# Patient Record
Sex: Female | Born: 1955 | Race: White | Hispanic: No | Marital: Married | Smoking: Former smoker
Health system: Southern US, Community
[De-identification: ages and names within clinical notes are randomized; demographics above are authoritative.]

## PROBLEM LIST (undated history)

## (undated) DIAGNOSIS — E119 Type 2 diabetes mellitus without complications: Secondary | ICD-10-CM

## (undated) DIAGNOSIS — I10 Essential (primary) hypertension: Secondary | ICD-10-CM

## (undated) HISTORY — PX: LEG SURGERY: SHX1003

---

## 2020-02-17 ENCOUNTER — Other Ambulatory Visit: Payer: Self-pay

## 2020-02-17 ENCOUNTER — Emergency Department (HOSPITAL_COMMUNITY): Payer: Managed Care, Other (non HMO)

## 2020-02-17 ENCOUNTER — Encounter (HOSPITAL_COMMUNITY): Payer: Self-pay | Admitting: Emergency Medicine

## 2020-02-17 ENCOUNTER — Emergency Department (HOSPITAL_COMMUNITY)
Admission: EM | Admit: 2020-02-17 | Discharge: 2020-02-17 | Disposition: A | Discharge status: Home / Self Care | Payer: Managed Care, Other (non HMO) | Attending: Emergency Medicine | Admitting: Emergency Medicine

## 2020-02-17 DIAGNOSIS — S92421A Displaced fracture of distal phalanx of right great toe, initial encounter for closed fracture: Secondary | ICD-10-CM | POA: Diagnosis not present

## 2020-02-17 DIAGNOSIS — R55 Syncope and collapse: Secondary | ICD-10-CM | POA: Insufficient documentation

## 2020-02-17 DIAGNOSIS — Z7984 Long term (current) use of oral hypoglycemic drugs: Secondary | ICD-10-CM | POA: Diagnosis not present

## 2020-02-17 DIAGNOSIS — I10 Essential (primary) hypertension: Secondary | ICD-10-CM | POA: Diagnosis not present

## 2020-02-17 DIAGNOSIS — R42 Dizziness and giddiness: Secondary | ICD-10-CM | POA: Diagnosis not present

## 2020-02-17 DIAGNOSIS — Z87891 Personal history of nicotine dependence: Secondary | ICD-10-CM | POA: Diagnosis not present

## 2020-02-17 DIAGNOSIS — Y9389 Activity, other specified: Secondary | ICD-10-CM | POA: Diagnosis not present

## 2020-02-17 DIAGNOSIS — Z79899 Other long term (current) drug therapy: Secondary | ICD-10-CM | POA: Diagnosis not present

## 2020-02-17 DIAGNOSIS — S92325A Nondisplaced fracture of second metatarsal bone, left foot, initial encounter for closed fracture: Secondary | ICD-10-CM | POA: Diagnosis not present

## 2020-02-17 DIAGNOSIS — S99922A Unspecified injury of left foot, initial encounter: Secondary | ICD-10-CM | POA: Diagnosis present

## 2020-02-17 DIAGNOSIS — Y9289 Other specified places as the place of occurrence of the external cause: Secondary | ICD-10-CM | POA: Insufficient documentation

## 2020-02-17 DIAGNOSIS — E119 Type 2 diabetes mellitus without complications: Secondary | ICD-10-CM | POA: Diagnosis not present

## 2020-02-17 DIAGNOSIS — W108XXA Fall (on) (from) other stairs and steps, initial encounter: Secondary | ICD-10-CM | POA: Diagnosis not present

## 2020-02-17 HISTORY — DX: Type 2 diabetes mellitus without complications: E11.9

## 2020-02-17 HISTORY — DX: Essential (primary) hypertension: I10

## 2020-02-17 LAB — CBC WITH DIFFERENTIAL/PLATELET
Abs Immature Granulocytes: 0.06 10*3/uL (ref 0.00–0.07)
Basophils Absolute: 0 10*3/uL (ref 0.0–0.1)
Basophils Relative: 0 %
Eosinophils Absolute: 0.1 10*3/uL (ref 0.0–0.5)
Eosinophils Relative: 1 %
HCT: 43.6 % (ref 36.0–46.0)
Hemoglobin: 13.6 g/dL (ref 12.0–15.0)
Immature Granulocytes: 1 %
Lymphocytes Relative: 19 %
Lymphs Abs: 2.5 10*3/uL (ref 0.7–4.0)
MCH: 30.9 pg (ref 26.0–34.0)
MCHC: 31.2 g/dL (ref 30.0–36.0)
MCV: 99.1 fL (ref 80.0–100.0)
Monocytes Absolute: 0.9 10*3/uL (ref 0.1–1.0)
Monocytes Relative: 7 %
Neutro Abs: 9.6 10*3/uL — ABNORMAL HIGH (ref 1.7–7.7)
Neutrophils Relative %: 72 %
Platelets: 245 10*3/uL (ref 150–400)
RBC: 4.4 MIL/uL (ref 3.87–5.11)
RDW: 12.5 % (ref 11.5–15.5)
WBC: 13.2 10*3/uL — ABNORMAL HIGH (ref 4.0–10.5)
nRBC: 0 % (ref 0.0–0.2)

## 2020-02-17 LAB — BASIC METABOLIC PANEL
Anion gap: 14 (ref 5–15)
BUN: 17 mg/dL (ref 8–23)
CO2: 22 mmol/L (ref 22–32)
Calcium: 9.4 mg/dL (ref 8.9–10.3)
Chloride: 102 mmol/L (ref 98–111)
Creatinine, Ser: 0.88 mg/dL (ref 0.44–1.00)
GFR calc Af Amer: >60 mL/min (ref >60)
GFR calc non Af Amer: >60 mL/min (ref >60)
Glucose, Bld: 171 mg/dL — ABNORMAL HIGH (ref 70–99)
Potassium: 4.6 mmol/L (ref 3.5–5.1)
Sodium: 138 mmol/L (ref 135–145)

## 2020-02-17 MED ORDER — TRAMADOL HCL 50 MG PO TABS: 50.0000 mg | ORAL_TABLET | Freq: Four times a day (QID) | ORAL | 0 refills | Status: DC | PRN

## 2020-02-17 MED ORDER — TRAMADOL HCL 50 MG PO TABS
50.0000 mg | ORAL_TABLET | Freq: Once | ORAL | Status: AC
  Administered 2020-02-17: 50 mg via ORAL
  Filled 2020-02-17: qty 1

## 2020-02-17 NOTE — ED Notes (Signed)
Pt assisted to the restroom via wheelchair

## 2020-02-17 NOTE — ED Notes (Signed)
Working at an [employer] down stairs when another person started up   [place] on the "last step"  Now with pain to her L foot and her R great toe  Also reports that she was told after she fell she "passed out" twice   Pt is neuro intact

## 2020-02-17 NOTE — ED Provider Notes (Signed)
[facility] EMERGENCY DEPARTMENT Provider Note   CSN: [id number] Arrival date & time: 02/17/20  1410     History Chief Complaint  Patient presents with  . Fall    Deborah Hill is a 64 y.o. female.  HPI      Deborah Hill is a 64 y.o. female with past medical history of type 2 diabetes and hypertension.  She presents to the Emergency Department complaining of a mechanical fall that occurred earlier today.  She was carrying objects down 2 steps when she turned around to see if someone was behind her causing her to fall down 1 step onto a landing.  She states that she did not lose consciousness, but was unable to stand due to pain in both feet.  Family members that were close by heard the fall and reports that she had brief syncopal episode after the fall.  Patient also endorses a brief episode of nausea and dizziness without vomiting.  Symptoms resolved after several minutes.  She denies any symptoms currently except for pain to both feet.  She denies symptoms prior to the fall.  History of prior traumatic ankle and lower leg surgery.  She denies any missed doses of her medications or history of hypoglycemia.     Past Medical History:  Diagnosis Date  . Diabetes mellitus without complication (HCC)   . Hypertension     There are no problems to display for this patient.   Past Surgical History:  Procedure Laterality Date  . LEG SURGERY Left      OB History   No obstetric history on file.     History reviewed. No pertinent family history.  Social History   Tobacco Use  . Smoking status: Former Games developer  . Smokeless tobacco: Never Used  Substance Use Topics  . Alcohol use: Never  . Drug use: Never    Home Medications Prior to Admission medications   Medication Sig Start Date End Date Taking? Authorizing Provider  lisinopril (ZESTRIL) 20 MG tablet  12/05/19  Yes [provider]  metFORMIN (GLUCOPHAGE) 500 MG tablet Take by mouth. 11/12/19  Yes [provider]  predniSONE (DELTASONE) 5 MG tablet TAKE 1/2 TABLET BY MOUTH ONCE A DAY 10/31/19  Yes [provider]    Allergies    Oxycodone-acetaminophen  Review of Systems   Review of Systems  Constitutional: Negative for chills and fever.  Respiratory: Negative for cough, chest tightness and wheezing.   Cardiovascular: Negative for chest pain.  Gastrointestinal: Positive for nausea. Negative for abdominal pain, diarrhea and vomiting.  Genitourinary: Negative for difficulty urinating and dysuria.  Musculoskeletal: Positive for arthralgias (Left knee, ankle, and foot pain as well as right great toe pain). Negative for joint swelling and neck pain.  Skin: Negative for color change and wound.  Neurological: Positive for syncope. Negative for dizziness, weakness, numbness and headaches.  Psychiatric/Behavioral: Negative for confusion.       Physical Exam Updated Vital Signs BP (!) 163/80 (BP Location: Right Arm)   Pulse 76   Temp 98.1 F (36.7 C) (Oral)   Resp 18   Ht 5\' 1"  (1.549 m)   Wt 77.1 kg   SpO2 100%   BMI 32.12 kg/m   Physical Exam Vitals and nursing note reviewed.  Constitutional:      Appearance: Normal appearance. She is not ill-appearing, toxic-appearing or diaphoretic.  HENT:     Head: Atraumatic.     Mouth/Throat:     Mouth: Mucous membranes are moist.  Cardiovascular:     Rate and Rhythm: Normal rate and regular rhythm.     Pulses: Normal pulses.  Pulmonary:     Effort: Pulmonary effort is normal.  Chest:     Chest wall: No tenderness.  Abdominal:     General: There is no distension.     Palpations: Abdomen is soft.     Tenderness: There is no abdominal tenderness.  Musculoskeletal:        General: Tenderness and signs of injury present.     Cervical back: Normal range of motion. No tenderness.     Comments: Tenderness to palpation of the anterior left knee.  No bony deformity or palpable effusion.  Patellar tendon appears intact.   Tenderness over the left lateral malleolus and dorsal foot.  No bony deformity.  Well-healed surgical scar to the anterior left ankle and lower leg.  Mild tenderness of the distal right great toe.  Nail is intact and without subungual hematoma.  Skin:    General: Skin is warm.     Capillary Refill: Capillary refill takes less than 2 seconds.     Findings: No rash.  Neurological:     General: No focal deficit present.     Mental Status: She is alert.     Sensory: No sensory deficit.     Motor: No weakness.     ED Results / Procedures / Treatments   Labs (all labs ordered are listed, but only abnormal results are displayed) Labs Reviewed  BASIC METABOLIC PANEL - Abnormal; Notable for the following components:      Result Value   Glucose, Bld 171 (*)    All other components within normal limits  CBC WITH DIFFERENTIAL/PLATELET - Abnormal; Notable for the following components:   WBC 13.2 (*)    Neutro Abs 9.6 (*)    All other components within normal limits    EKG EKG Interpretation  Date/Time:  Saturday February 17 2020 16:41:42 EDT Ventricular Rate:  68 PR Interval:    QRS Duration: 90 QT Interval:  386 QTC Calculation: 411 R Axis:   55 Text Interpretation: Sinus rhythm Baseline wander in lead(s) I III aVL V1 no clear ST changes Confirmed by Benjiman Core [phone]) on 02/17/2020 4:55:27 PM   Radiology DG Foot 2 Views Left  Result Date: 02/17/2020 CLINICAL DATA:  Bilateral foot pain after fall down steps. EXAM: LEFT FOOT - 2 VIEW COMPARISON:  None. FINDINGS: Possible transverse nondisplaced fracture of the proximal second metatarsal shaft. No additional fracture of the foot. No dislocation or malalignment. There is a small plantar calcaneal spur. No focal soft tissue abnormality. IMPRESSION: Possible transverse nondisplaced fracture of the proximal second metatarsal shaft. Recommend correlation with focal tenderness. Electronically Signed   By: Narda Rutherford M.D.   On:  02/17/2020 15:16   DG Foot 2 Views Right  Result Date: 02/17/2020 CLINICAL DATA:  Bilateral foot pain after fall down steps. EXAM: RIGHT FOOT - 2 VIEW COMPARISON:  None. FINDINGS: Acute transverse fracture of the great toe distal phalanx. This is minimally displaced. There is no definite intra-articular extension. No additional fracture of the foot. Moderate plantar calcaneal spur and small Achilles tendon enthesophyte. No focal soft tissue abnormality. IMPRESSION: Acute minimally displaced fracture of the great toe distal phalanx. Electronically Signed   By: Narda Rutherford M.D.   On: 02/17/2020 15:15    Procedures Procedures (including critical care time)  Medications Ordered in ED Medications - No data to display  ED Course  I have reviewed the triage vital signs and the nursing notes.  Pertinent labs & imaging results that were available during my care of the patient were reviewed by me and considered in my medical decision making (see chart for details).    MDM Rules/Calculators/A&P                          Patient here with bilateral foot pain and left knee pain secondary to a mechanical fall that occurred earlier today.  Reported syncopal episode after fall, witnessed by family members.  Episodes reported as brief and felt to be secondary to pain.  No reported head injury  Patient has pain of the bilateral feet.  X-ray shows fracture of the second metatarsal of the left foot and a fracture of the distal phalanx of the right great toe.  Blood work and EKG are reassuring.  Cam boot was placed on patient's left foot and right great toe was buddy taped.  She has crutches.  She agrees to close orthopedic follow-up.  Return precautions were discussed.  Final Clinical Impression(s) / ED Diagnoses Final diagnoses:  Closed nondisplaced fracture of second metatarsal bone of left foot, initial encounter  Closed displaced fracture of distal phalanx of right great toe, initial encounter     Rx / DC Orders ED Discharge Orders    None       Rosey Bath 02/17/20 1844    Benjiman Core, MD 02/17/20 2348

## 2020-02-17 NOTE — ED Triage Notes (Signed)
Pt states she was walking down some stairs when she fell, only fell down 1-2 steps. Pt doesn't remember fall, was told she passed out twice. States she got very dizzy and nauseous. Pt have left foot pain and toe pain on the right foot. Pt did not take anything for pain medication prior to arrival.

## 2020-02-17 NOTE — Discharge Instructions (Signed)
Your x-rays today show that you have a broken bone in the right big toe and a metatarsal fracture of your left foot.  Keep your right toe buddy taped.  You may remove the cam boot for bathing.  Elevate your left foot when possible.  Call Dr. Magdalene Patricia office on Monday to arrange a follow-up appointment.

## 2020-02-20 ENCOUNTER — Ambulatory Visit (INDEPENDENT_AMBULATORY_CARE_PROVIDER_SITE_OTHER): Payer: Managed Care, Other (non HMO) | Admitting: Orthopaedic Surgery

## 2020-02-20 ENCOUNTER — Other Ambulatory Visit: Payer: Self-pay

## 2020-02-20 ENCOUNTER — Encounter: Payer: Self-pay | Admitting: Orthopaedic Surgery

## 2020-02-20 VITALS — BP 153/79 | HR 79 | Ht 61.0 in

## 2020-02-20 DIAGNOSIS — S92325A Nondisplaced fracture of second metatarsal bone, left foot, initial encounter for closed fracture: Secondary | ICD-10-CM

## 2020-02-20 DIAGNOSIS — S92424A Nondisplaced fracture of distal phalanx of right great toe, initial encounter for closed fracture: Secondary | ICD-10-CM

## 2020-02-20 NOTE — Progress Notes (Signed)
Subjective:    Patient ID: Deborah Hill, female    DOB: Nov 06, 1955, 64 y.o.   MRN: [id number]  HPI She hurt her left foot and right great toe in a fall at her home on 02-17-2020.  She was seen in the ER.  X-rays showed fractures: On the right foot: IMPRESSION: Acute minimally displaced fracture of the great toe distal phalanx.  On the left foot: IMPRESSION: Possible transverse nondisplaced fracture of the proximal second metatarsal shaft. Recommend correlation with focal tenderness.  I have independently reviewed and interpreted x-rays of this patient done at another site by another physician or qualified health professional.  I have reviewed the ER notes.  She has little pain of the right great toe. The left foot is tender on the dorsum.  She has no other injury.  She is taking Tylenol which helps. She has crutches.  Review of Systems  Constitutional: Positive for activity change.  Musculoskeletal: Positive for arthralgias, gait problem and joint swelling.  All other systems reviewed and are negative.  For Review of Systems, all other systems reviewed and are negative.  The following is a summary of the past history medically, past history surgically, known current medicines, social history and family history.  This information is gathered electronically by the computer from prior information and documentation.  I review this each visit and have found including this information at this point in the chart is beneficial and informative.   Past Medical History:  Diagnosis Date  . Diabetes mellitus without complication (HCC)   . Hypertension     Past Surgical History:  Procedure Laterality Date  . LEG SURGERY Left     Current Outpatient Medications on File Prior to Visit  Medication Sig Dispense Refill  . lisinopril (ZESTRIL) 20 MG tablet     . metFORMIN (GLUCOPHAGE) 500 MG tablet Take by mouth.    . predniSONE (DELTASONE) 5 MG tablet TAKE 1/2 TABLET BY MOUTH ONCE A DAY      No current facility-administered medications on file prior to visit.    Social History   Socioeconomic History  . Marital status: Married    Spouse name: Not on file  . Number of children: Not on file  . Years of education: Not on file  . Highest education level: Not on file  Occupational History  . Not on file  Tobacco Use  . Smoking status: Former Games developer  . Smokeless tobacco: Never Used  Substance and Sexual Activity  . Alcohol use: Never  . Drug use: Never  . Sexual activity: Not on file  Other Topics Concern  . Not on file  Social History Narrative  . Not on file   Social Determinants of Health   Financial Resource Strain:   . Difficulty of Paying Living Expenses: Not on file  Food Insecurity:   . Worried About Programme researcher, broadcasting/film/video in the Last Year: Not on file  . Ran Out of Food in the Last Year: Not on file  Transportation Needs:   . Lack of Transportation (Medical): Not on file  . Lack of Transportation (Non-Medical): Not on file  Physical Activity:   . Days of Exercise per Week: Not on file  . Minutes of Exercise per Session: Not on file  Stress:   . Feeling of Stress : Not on file  Social Connections:   . Frequency of Communication with Friends and Family: Not on file  . Frequency of Social Gatherings with Friends and Family: Not on  file  . Attends Religious Services: Not on file  . Active Member of Clubs or Organizations: Not on file  . Attends Banker Meetings: Not on file  . Marital Status: Not on file  Intimate Partner Violence:   . Fear of Current or Ex-Partner: Not on file  . Emotionally Abused: Not on file  . Physically Abused: Not on file  . Sexually Abused: Not on file    History reviewed. No pertinent family history.  BP (!) 153/79   Pulse 79   Ht 5\' 1"  (1.549 m)   BMI 32.12 kg/m   Body mass index is 32.12 kg/m.      Objective:   Physical Exam Vitals and nursing note reviewed. Exam conducted with a chaperone  present.  Constitutional:      Appearance: She is well-developed.  HENT:     Head: Normocephalic and atraumatic.  Eyes:     Conjunctiva/sclera: Conjunctivae normal.     Pupils: Pupils are equal, round, and reactive to light.  Cardiovascular:     Rate and Rhythm: Normal rate and regular rhythm.  Pulmonary:     Effort: Pulmonary effort is normal.  Abdominal:     Palpations: Abdomen is soft.  Musculoskeletal:     Cervical back: Normal range of motion and neck supple.       Feet:  Skin:    General: Skin is warm and dry.  Neurological:     Mental Status: She is alert and oriented to person, place, and time.     Cranial Nerves: No cranial nerve deficit.     Motor: No abnormal muscle tone.     Coordination: Coordination normal.     Deep Tendon Reflexes: Reflexes are normal and symmetric. Reflexes normal.  Psychiatric:        Behavior: Behavior normal.        Thought Content: Thought content normal.        Judgment: Judgment normal.           Assessment & Plan:   Encounter Diagnoses  Name Primary?  . Closed nondisplaced fracture of second metatarsal bone of left foot, initial encounter Yes  . Closed nondisplaced fracture of distal phalanx of right great toe, initial encounter    She is to continue the crutches, limit weight bearing.  Return in two weeks.  X-rays both feet on return.  Call if any problem.  Precautions discussed.   Electronically Signed , MD 9/28/202110:27 AM

## 2020-02-20 NOTE — Patient Instructions (Signed)
It will take several weeks for your fractures to feel better try not to over do your activity. If you are on them more they will be painful and swell  You do not need any surgery at this point but if the fracture displaces, you may need it.  We will see you back in 2 weeks.

## 2020-03-05 ENCOUNTER — Ambulatory Visit: Payer: Managed Care, Other (non HMO)

## 2020-03-05 ENCOUNTER — Ambulatory Visit (INDEPENDENT_AMBULATORY_CARE_PROVIDER_SITE_OTHER): Payer: Managed Care, Other (non HMO) | Admitting: Orthopaedic Surgery

## 2020-03-05 ENCOUNTER — Other Ambulatory Visit: Payer: Self-pay

## 2020-03-05 ENCOUNTER — Encounter: Payer: Self-pay | Admitting: Orthopaedic Surgery

## 2020-03-05 VITALS — Ht 61.0 in | Wt 170.0 lb

## 2020-03-05 DIAGNOSIS — S92424D Nondisplaced fracture of distal phalanx of right great toe, subsequent encounter for fracture with routine healing: Secondary | ICD-10-CM

## 2020-03-05 DIAGNOSIS — S92325D Nondisplaced fracture of second metatarsal bone, left foot, subsequent encounter for fracture with routine healing: Secondary | ICD-10-CM | POA: Diagnosis not present

## 2020-03-05 NOTE — Progress Notes (Signed)
I am better  She is having no pain.  She had X-rays of the left and right foot, reported separately.  Encounter Diagnoses  Name Primary?  . Closed nondisplaced fracture of second metatarsal bone of left foot with routine healing, subsequent encounter Yes  . Closed nondisplaced fracture of distal phalanx of right great toe with routine healing, subsequent encounter    She can stop the CAM walker.  Resume normal shoes.  Return in one month.  Call if any problem.  Precautions discussed.   Electronically Signed Darreld Mclean, MD 10/12/20219:11 AM

## 2020-04-02 ENCOUNTER — Ambulatory Visit: Payer: Managed Care, Other (non HMO) | Admitting: Orthopaedic Surgery

## 2020-08-22 ENCOUNTER — Other Ambulatory Visit: Payer: Self-pay | Admitting: Internal Medicine

## 2020-08-22 DIAGNOSIS — Z1231 Encounter for screening mammogram for malignant neoplasm of breast: Secondary | ICD-10-CM

## 2020-08-26 ENCOUNTER — Ambulatory Visit
Admission: RE | Admit: 2020-08-26 | Discharge: 2020-08-26 | Disposition: A | Discharge status: Home / Self Care | Payer: Managed Care, Other (non HMO) | Source: Ambulatory Visit | Attending: Internal Medicine | Admitting: Internal Medicine

## 2020-08-26 ENCOUNTER — Other Ambulatory Visit: Payer: Self-pay

## 2020-08-26 DIAGNOSIS — Z1231 Encounter for screening mammogram for malignant neoplasm of breast: Secondary | ICD-10-CM

## 2020-08-28 ENCOUNTER — Other Ambulatory Visit: Payer: Self-pay | Admitting: Internal Medicine

## 2020-08-28 DIAGNOSIS — R928 Other abnormal and inconclusive findings on diagnostic imaging of breast: Secondary | ICD-10-CM

## 2020-09-23 ENCOUNTER — Ambulatory Visit
Admission: RE | Admit: 2020-09-23 | Discharge: 2020-09-23 | Disposition: A | Discharge status: Home / Self Care | Payer: Managed Care, Other (non HMO) | Source: Ambulatory Visit | Attending: Internal Medicine | Admitting: Internal Medicine

## 2020-09-23 ENCOUNTER — Other Ambulatory Visit: Payer: Self-pay

## 2020-09-23 DIAGNOSIS — R928 Other abnormal and inconclusive findings on diagnostic imaging of breast: Secondary | ICD-10-CM

## 2021-08-06 ENCOUNTER — Other Ambulatory Visit: Payer: Self-pay | Admitting: Internal Medicine

## 2021-08-06 DIAGNOSIS — Z1231 Encounter for screening mammogram for malignant neoplasm of breast: Secondary | ICD-10-CM

## 2021-09-03 ENCOUNTER — Ambulatory Visit
Admission: RE | Admit: 2021-09-03 | Discharge: 2021-09-03 | Disposition: A | Discharge status: Home / Self Care | Payer: Managed Care, Other (non HMO) | Source: Ambulatory Visit | Attending: Internal Medicine | Admitting: Internal Medicine

## 2021-09-03 DIAGNOSIS — Z1231 Encounter for screening mammogram for malignant neoplasm of breast: Secondary | ICD-10-CM

## 2022-02-05 ENCOUNTER — Encounter (INDEPENDENT_AMBULATORY_CARE_PROVIDER_SITE_OTHER): Payer: Self-pay | Admitting: *Deleted

## 2022-04-27 ENCOUNTER — Encounter (INDEPENDENT_AMBULATORY_CARE_PROVIDER_SITE_OTHER): Payer: Self-pay | Admitting: Gastroenterology

## 2022-04-27 ENCOUNTER — Ambulatory Visit (INDEPENDENT_AMBULATORY_CARE_PROVIDER_SITE_OTHER): Payer: Managed Care, Other (non HMO) | Admitting: Gastroenterology

## 2022-04-27 VITALS — BP 141/84 | HR 97 | Temp 97.1°F | Ht 61.5 in | Wt 186.2 lb

## 2022-04-27 DIAGNOSIS — K760 Fatty (change of) liver, not elsewhere classified: Secondary | ICD-10-CM | POA: Insufficient documentation

## 2022-04-27 DIAGNOSIS — R7989 Other specified abnormal findings of blood chemistry: Secondary | ICD-10-CM | POA: Insufficient documentation

## 2022-04-27 NOTE — Progress Notes (Signed)
Katrinka Blazing, M.D. Gastroenterology & Hepatology [facility] [address] [place], [place] [address] Primary Care Physician: Kirstie Peri, MD [address] [place] [address]  Referring MD: PCP  Chief Complaint: Elevated liver function tests and fatty liver  History of Present Illness: Deborah Hill is a 66 y.o. female with PMH DM, HTN, diverticulosis, who presents for evaluation of elevated liver function test and fatty liver.  The patient reports that she was found to have elevated LFTs and was referred to our clinic.  She denies having any complaints.  The patient denies having any nausea, vomiting, fever, chills, hematochezia, melena, hematemesis, abdominal distention, abdominal pain, diarrhea, jaundice, pruritus or weight loss.    She does not take any herbs or supplements, only takes Vit C, calcium, Mg,omega fish oil, Vit D as OTC medicines.  The patient was referred to our clinic for evaluation of elevated liver function tests.  Most recent labs from 12/19/2021 showed an AST of 70, ALT 63, total bilirubin 0.4, alkaline phosphatase 85, normal electrolytes, creatinine 0.85, albumin 4.4, CBC with hemoglobin of 12.9, WBC 8.6, platelets 246.  Other labs from 02/05/2022 showed a negative acute hepatitis panel and HIV test.  Ultrasound of the abdomen was performed on 01/29/2022 which showed hepatic steatosis and a benign liver hemangioma. No previous LFTs are available.  Patient reports that she is not following any diet and she eats frequently white bread and some carbs in her diet.  Tries to stay active as she walks at least 20 minutes/day.  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 She was given prednisone 2.5 mg daily at that time and finally she quit using it at the beginning of 2023.  Last Colonoscopy: 2019 - Dr. Marcha Solders at [facility], diverticulosis, was told to repeat in 10 years  FHx: neg for any gastrointestinal/liver disease,  no malignancies Social: quit smoking at age 85 , neg alcohol or illicit drug use Surgical: tubal ligation  Past Medical History: Past Medical History:  Diagnosis Date   Diabetes mellitus without complication (HCC)    Hypertension     Past Surgical History: Past Surgical History:  Procedure Laterality Date   LEG SURGERY Left     Family History: Family History  Problem Relation Age of Onset   Breast cancer Paternal Aunt     Social History: Social History   Tobacco Use  Smoking Status Former  Smokeless Tobacco Never   Social History   Substance and Sexual Activity  Alcohol Use Never   Social History   Substance and Sexual Activity  Drug Use Never    Allergies: Allergies  Allergen Reactions   Oxycodone-Acetaminophen     Lay down in the floor.     Medications: Current Outpatient Medications  Medication Sig Dispense Refill   clonazePAM (KLONOPIN) 0.5 MG tablet Take 0.5 mg by mouth 2 (two) times daily as needed for anxiety.     glimepiride (AMARYL) 2 MG tablet Take 2 mg by mouth daily with breakfast.     lisinopril (ZESTRIL) 20 MG tablet Take 20 mg by mouth daily.     metFORMIN (GLUCOPHAGE) 500 MG tablet Take 500 mg by mouth 2 (two) times daily with a meal.     OVER THE COUNTER MEDICATION Vit D 3 5,000 IU Every other day.     OVER THE COUNTER MEDICATION Omega fish oil 369 mg every other day     OVER THE COUNTER MEDICATION Calcium w Magnesium  and Zinc Every other day.     OVER THE COUNTER MEDICATION Vit C 1000 mg Every other day.     No current facility-administered medications for this visit.    Review of Systems: GENERAL: negative for malaise, night sweats HEENT: No changes in hearing or vision, no nose bleeds or other nasal problems. NECK: Negative for lumps, goiter, pain and significant neck swelling RESPIRATORY: Negative for cough, wheezing CARDIOVASCULAR: Negative for chest pain, leg swelling, palpitations, orthopnea GI: SEE HPI MUSCULOSKELETAL:  Negative for joint pain or swelling, back pain, and muscle pain. SKIN: Negative for lesions, rash PSYCH: Negative for sleep disturbance, mood disorder and recent psychosocial stressors. HEMATOLOGY Negative for prolonged bleeding, bruising easily, and swollen nodes. ENDOCRINE: Negative for cold or heat intolerance, polyuria, polydipsia and goiter. NEURO: negative for tremor, gait imbalance, syncope and seizures. The remainder of the review of systems is noncontributory.   Physical Exam: BP (!) 141/84 (BP Location: Left Arm, Patient Position: Sitting, Cuff Size: Large)   Pulse 97   Temp (!) 97.1 F (36.2 C) (Temporal)   Ht 5' 1.5" (1.562 m)   Wt 186 lb 3.2 oz (84.5 kg)   BMI 34.61 kg/m  GENERAL: The patient is AO x3, in no acute distress. Obese. HEENT: Head is normocephalic and atraumatic. EOMI are intact. Mouth is well hydrated and without lesions. NECK: Supple. No masses LUNGS: Clear to auscultation. No presence of rhonchi/wheezing/rales. Adequate chest expansion HEART: RRR, normal s1 and s2. ABDOMEN: Soft, nontender, no guarding, no peritoneal signs, and nondistended. BS +. No masses. EXTREMITIES: Without any cyanosis, clubbing, rash, lesions or edema. NEUROLOGIC: AOx3, no focal motor deficit. SKIN: no jaundice, no rashes   Imaging/Labs: as above  I personally reviewed and interpreted the available labs, imaging and endoscopic files.  Impression and Plan: Deborah Hill is a 66 y.o. female with PMH DM, HTN, diverticulosis, who presents for evaluation of elevated liver function test and fatty liver.  The patient has been asymptomatic but was found to have moderate elevation of her aminotransferases.  She had an negative acute hepatitis panel but was found to have fatty liver.  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  However, will rule out other autoimmune or metabolic etiologies with blood workup today.  I advised the patient to work on weight loss  measures to halt the progression of her liver disease, she will work on the dietary changes for this.  The patient was found to have elevated blood pressure when vital signs were checked in the office. The blood pressure was rechecked by the nursing staff and it was found be persistently elevated >140/90 mmHg. I personally advised to the patient to follow up closely at home and with his PCP for hypertension control  - Check daily iron panel, ANA, AMA, ASMA, IgG, ceruloplasmin, A1AT, - Explained to the patient the etiology and consequences of her current liver disease. Patient was counseled about the benefit of implementing a Mediterranean diet and exercise plan to decrease at least 5% of weight. The patient understood about the importance of lifestyle changes to potentially reverse his liver involvement.  All questions were answered.      Katrinka Blazing, MD Gastroenterology and Hepatology [facility] Gastroenterology

## 2022-04-27 NOTE — Patient Instructions (Addendum)
Perform blood workup - Explained to the patient the etiology and consequences of his current liver disease. Patient was counseled about the benefit of implementing a Mediterranean diet and exercise plan to decrease at least 5% of weight. The patient understood about the importance of lifestyle changes to potentially reverse his liver involvement. The patient was found to have elevated blood pressure when vital signs were checked in the office. The blood pressure was rechecked by the nursing staff and it was found be persistently elevated >140/90 mmHg. I personally advised to the patient to follow up closely at home and with his PCP for hypertension control

## 2022-05-06 LAB — COMPREHENSIVE METABOLIC PANEL
AG Ratio: 1.5 (calc) (ref 1.0–2.5)
ALT: 87 U/L — ABNORMAL HIGH (ref 6–29)
AST: 83 U/L — ABNORMAL HIGH (ref 10–35)
Albumin: 4.6 g/dL (ref 3.6–5.1)
Alkaline phosphatase (APISO): 94 U/L (ref 37–153)
BUN: 12 mg/dL (ref 7–25)
CO2: 23 mmol/L (ref 20–32)
Calcium: 10.2 mg/dL (ref 8.6–10.4)
Chloride: 102 mmol/L (ref 98–110)
Creat: 0.82 mg/dL (ref 0.50–1.05)
Globulin: 3.1 g/dL (calc) (ref 1.9–3.7)
Glucose, Bld: 117 mg/dL — ABNORMAL HIGH (ref 65–99)
Potassium: 4.2 mmol/L (ref 3.5–5.3)
Sodium: 139 mmol/L (ref 135–146)
Total Bilirubin: 0.4 mg/dL (ref 0.2–1.2)
Total Protein: 7.7 g/dL (ref 6.1–8.1)

## 2022-05-06 LAB — IRON,TIBC AND FERRITIN PANEL
%SAT: 15 % (calc) — ABNORMAL LOW (ref 16–45)
Ferritin: 76 ng/mL (ref 16–288)
Iron: 63 ug/dL (ref 45–160)
TIBC: 409 mcg/dL (calc) (ref 250–450)

## 2022-05-06 LAB — CERULOPLASMIN: Ceruloplasmin: 32 mg/dL (ref 18–53)

## 2022-05-06 LAB — CBC WITH DIFFERENTIAL/PLATELET
Absolute Monocytes: 1003 cells/uL — ABNORMAL HIGH (ref 200–950)
Basophils Absolute: 55 cells/uL (ref 0–200)
Basophils Relative: 0.5 %
Eosinophils Absolute: 185 cells/uL (ref 15–500)
Eosinophils Relative: 1.7 %
HCT: 41.4 % (ref 35.0–45.0)
Hemoglobin: 14 g/dL (ref 11.7–15.5)
Lymphs Abs: 3325 cells/uL (ref 850–3900)
MCH: 30.7 pg (ref 27.0–33.0)
MCHC: 33.8 g/dL (ref 32.0–36.0)
MCV: 90.8 fL (ref 80.0–100.0)
MPV: 10.6 fL (ref 7.5–12.5)
Monocytes Relative: 9.2 %
Neutro Abs: 6333 cells/uL (ref 1500–7800)
Neutrophils Relative %: 58.1 %
Platelets: 323 10*3/uL (ref 140–400)
RBC: 4.56 10*6/uL (ref 3.80–5.10)
RDW: 12.6 % (ref 11.0–15.0)
Total Lymphocyte: 30.5 %
WBC: 10.9 10*3/uL — ABNORMAL HIGH (ref 3.8–10.8)

## 2022-05-06 LAB — ANTI-NUCLEAR AB-TITER (ANA TITER): ANA Titer 1: 1:80 {titer} — ABNORMAL HIGH

## 2022-05-06 LAB — ANTI-SMOOTH MUSCLE ANTIBODY, IGG: Actin (Smooth Muscle) Antibody (IGG): <20 U (ref <20)

## 2022-05-06 LAB — ALPHA-1 ANTITRYPSIN PHENOTYPE: A-1 Antitrypsin, Ser: 144 mg/dL (ref 83–199)

## 2022-05-06 LAB — ANA: Anti Nuclear Antibody (ANA): POSITIVE — AB

## 2022-05-06 LAB — IGG: IgG (Immunoglobin G), Serum: 1031 mg/dL (ref 600–1540)

## 2022-07-23 ENCOUNTER — Encounter: Payer: Self-pay | Admitting: Radiology

## 2022-11-09 ENCOUNTER — Ambulatory Visit (INDEPENDENT_AMBULATORY_CARE_PROVIDER_SITE_OTHER): Payer: Managed Care, Other (non HMO) | Admitting: Gastroenterology

## 2022-11-09 ENCOUNTER — Encounter (INDEPENDENT_AMBULATORY_CARE_PROVIDER_SITE_OTHER): Payer: Self-pay | Admitting: Gastroenterology

## 2022-11-09 VITALS — BP 127/83 | HR 80 | Temp 97.8°F | Ht 61.5 in | Wt 181.0 lb

## 2022-11-09 DIAGNOSIS — K7581 Nonalcoholic steatohepatitis (NASH): Secondary | ICD-10-CM | POA: Diagnosis not present

## 2022-11-09 DIAGNOSIS — R7989 Other specified abnormal findings of blood chemistry: Secondary | ICD-10-CM

## 2022-11-09 LAB — COMPREHENSIVE METABOLIC PANEL
AG Ratio: 1.5 (calc) (ref 1.0–2.5)
ALT: 61 U/L — ABNORMAL HIGH (ref 6–29)
AST: 45 U/L — ABNORMAL HIGH (ref 10–35)
Albumin: 4.3 g/dL (ref 3.6–5.1)
Alkaline phosphatase (APISO): 83 U/L (ref 37–153)
BUN: 14 mg/dL (ref 7–25)
CO2: 26 mmol/L (ref 20–32)
Calcium: 10.3 mg/dL (ref 8.6–10.4)
Chloride: 104 mmol/L (ref 98–110)
Creat: 0.88 mg/dL (ref 0.50–1.05)
Globulin: 2.9 g/dL (calc) (ref 1.9–3.7)
Glucose, Bld: 118 mg/dL (ref 65–139)
Potassium: 4.7 mmol/L (ref 3.5–5.3)
Sodium: 140 mmol/L (ref 135–146)
Total Bilirubin: 0.3 mg/dL (ref 0.2–1.2)
Total Protein: 7.2 g/dL (ref 6.1–8.1)

## 2022-11-09 NOTE — Progress Notes (Signed)
Katrinka Blazing, M.D. Gastroenterology & Hepatology [facility] Gastroenterology [address] [place], [place] [address]  Primary Care Physician: Kirstie Peri, MD [address] [place] [address]  I will communicate my assessment and recommendations to the referring MD via EMR.  Problems: Elevated LFTs Hepatic steatosis  History of Present Illness: DEVINEE TING is a 67 y.o. female with PMH DM, HTN, diverticulosis, who presents for follow-up of elevated liver function test and fatty liver.   The patient was last seen on 04/27/2022. At that time, the patient was advised to implement Mediterranean diet.  Other labs were checked which showed mildly deep ANA titer of 1:80, persistently elevated and transferase with AST of 83, ALT 87, alkaline phosphatase 94, total bilirubin 0.4, normal IgG, alpha-1 antitrypsin, ceruloplasmin, ASMA.  Notably her iron saturation was borderline at 15% but ferritin was normal at 76 and iron was 63.  The patient denies having any nausea, vomiting, fever, chills, hematochezia, melena, hematemesis, abdominal distention, abdominal pain, diarrhea, jaundice, pruritus. Has lost 5 lb since the last time she was seen in the office.  She has tried to implement the Mediterranean diet as much as possible.  Previous workup includes : 02/05/2022 showed a negative acute hepatitis panel and HIV test.  Ultrasound of the abdomen was performed on 01/29/2022 which showed hepatic steatosis and a benign liver hemangioma.   Last Colonoscopy: 2019 - Dr. Marcha Solders at [facility], diverticulosis, was told to repeat in 10 years   Past Medical History: Past Medical History:  Diagnosis Date   Diabetes mellitus without complication (HCC)    Hypertension     Past Surgical History: Past Surgical History:  Procedure Laterality Date   LEG SURGERY Left     Family History: Family History  Problem Relation Age of Onset   Breast cancer Paternal Aunt     Social  History: Social History   Tobacco Use  Smoking Status Former  Smokeless Tobacco Never   Social History   Substance and Sexual Activity  Alcohol Use Never   Social History   Substance and Sexual Activity  Drug Use Never    Allergies: Allergies  Allergen Reactions   Oxycodone-Acetaminophen     Lay down in the floor.     Medications: Current Outpatient Medications  Medication Sig Dispense Refill   clonazePAM (KLONOPIN) 0.5 MG tablet Take 0.5 mg by mouth 2 (two) times daily as needed for anxiety.     glipiZIDE (GLUCOTROL) 5 MG tablet Take 5 mg by mouth daily before breakfast.     lisinopril (ZESTRIL) 20 MG tablet Take 20 mg by mouth daily.     metFORMIN (GLUCOPHAGE) 500 MG tablet Take 500 mg by mouth 2 (two) times daily with a meal. 500 mg One tablet in mornings and two at bedtime.     OVER THE COUNTER MEDICATION Vit D 3 5,000 IU Every other day.     OVER THE COUNTER MEDICATION Omega fish oil 369 mg every other day     OVER THE COUNTER MEDICATION Calcium w Magnesium and Zinc Every other day.     OVER THE COUNTER MEDICATION Vit C 1000 mg Every other day.     No current facility-administered medications for this visit.    Review of Systems: GENERAL: negative for malaise, night sweats HEENT: No changes in hearing or vision, no nose bleeds or other nasal problems. NECK: Negative for lumps, goiter, pain and significant neck swelling RESPIRATORY: Negative for cough, wheezing CARDIOVASCULAR: Negative for chest pain,  leg swelling, palpitations, orthopnea GI: SEE HPI MUSCULOSKELETAL: Negative for joint pain or swelling, back pain, and muscle pain. SKIN: Negative for lesions, rash PSYCH: Negative for sleep disturbance, mood disorder and recent psychosocial stressors. HEMATOLOGY Negative for prolonged bleeding, bruising easily, and swollen nodes. ENDOCRINE: Negative for cold or heat intolerance, polyuria, polydipsia and goiter. NEURO: negative for tremor, gait imbalance, syncope  and seizures. The remainder of the review of systems is noncontributory.   Physical Exam: BP 127/83 (BP Location: Left Arm, Patient Position: Sitting, Cuff Size: Large)   Pulse 80   Temp 97.8 F (36.6 C) (Temporal)   Ht 5' 1.5" (1.562 m)   Wt 181 lb (82.1 kg)   BMI 33.65 kg/m  GENERAL: The patient is AO x3, in no acute distress. HEENT: Head is normocephalic and atraumatic. EOMI are intact. Mouth is well hydrated and without lesions. NECK: Supple. No masses LUNGS: Clear to auscultation. No presence of rhonchi/wheezing/rales. Adequate chest expansion HEART: RRR, normal s1 and s2. ABDOMEN: Soft, nontender, no guarding, no peritoneal signs, and nondistended. BS +. No masses. EXTREMITIES: Without any cyanosis, clubbing, rash, lesions or edema. NEUROLOGIC: AOx3, no focal motor deficit. SKIN: no jaundice, no rashes  Imaging/Labs: as above  I personally reviewed and interpreted the available labs, imaging and endoscopic files.  Impression and Plan: ORCHID SCHMEISER is a 67 y.o. female with PMH DM, HTN, diverticulosis, who presents for follow-up of elevated liver function test and fatty liver.  Patient has presented some improvement of her weight with the dietary changes she has implemented.  We will evaluate repeat LFTs at this point.  I encouraged her to continue with a matron diet and continue with weight loss measures.  We will consider performing an elastography during her next appointment in a year.  -Continue Mediterranean diet and exercise to achieve further weight loss -Check repeat CMP -Will discuss possibility of elastography on follow up  All questions were answered.      Katrinka Blazing, MD Gastroenterology and Hepatology [facility] Gastroenterology

## 2022-11-09 NOTE — Patient Instructions (Addendum)
Continue Mediterranean diet and exercise to achieve further weight loss Perform blood workup Will discuss possibility of elastography on follow up

## 2022-11-10 ENCOUNTER — Encounter (INDEPENDENT_AMBULATORY_CARE_PROVIDER_SITE_OTHER): Payer: Self-pay

## 2023-01-05 ENCOUNTER — Other Ambulatory Visit: Payer: Self-pay | Admitting: Internal Medicine

## 2023-01-05 DIAGNOSIS — Z1231 Encounter for screening mammogram for malignant neoplasm of breast: Secondary | ICD-10-CM

## 2023-02-02 ENCOUNTER — Inpatient Hospital Stay: Admission: RE | Admit: 2023-02-02 | Payer: Managed Care, Other (non HMO) | Source: Ambulatory Visit

## 2023-02-09 ENCOUNTER — Ambulatory Visit
Admission: RE | Admit: 2023-02-09 | Discharge: 2023-02-09 | Disposition: A | Discharge status: Home / Self Care | Payer: Managed Care, Other (non HMO) | Source: Ambulatory Visit | Attending: Internal Medicine | Admitting: Internal Medicine

## 2023-02-09 ENCOUNTER — Other Ambulatory Visit: Payer: Self-pay | Admitting: Nurse Practitioner

## 2023-02-09 DIAGNOSIS — Z1231 Encounter for screening mammogram for malignant neoplasm of breast: Secondary | ICD-10-CM

## 2023-04-06 ENCOUNTER — Telehealth (INDEPENDENT_AMBULATORY_CARE_PROVIDER_SITE_OTHER): Payer: Self-pay | Admitting: Gastroenterology

## 2023-04-06 NOTE — Telephone Encounter (Signed)
I received blood workup from 03/31/2023 that showed CMP with ALT 81, AST 68, alkaline phosphatase 100, total bilirubin 0.3, calcium 10.8, albumin 4.6, glucose 177, creatinine 0.93, sodium 137, potassium 4.8, hemoglobin A1c 6.9, cholesterol 246, triglycerides 210, LDL 147, HDL 61, TSH 3.1, vitamin D 58, CBC with WBC 8.9, hemoglobin 14.0 and platelets 339.  This corresponds to a not fully score of -1.59 -low risk for advanced fibrosis.

## 2023-10-30 IMAGING — MG MM DIGITAL SCREENING BILAT W/ TOMO AND CAD
6 of 10 series · 6 of 30 positions shown · non-contrast
Comparison: Previous exam(s).

CLINICAL DATA: Screening.

EXAM:
DIGITAL SCREENING BILATERAL MAMMOGRAM WITH TOMOSYNTHESIS AND CAD
TECHNIQUE: Bilateral screening digital craniocaudal and mediolateral oblique
mammograms were obtained. Bilateral screening digital breast
tomosynthesis was performed. The images were evaluated with
computer-aided detection.

[R CC synth-2D]
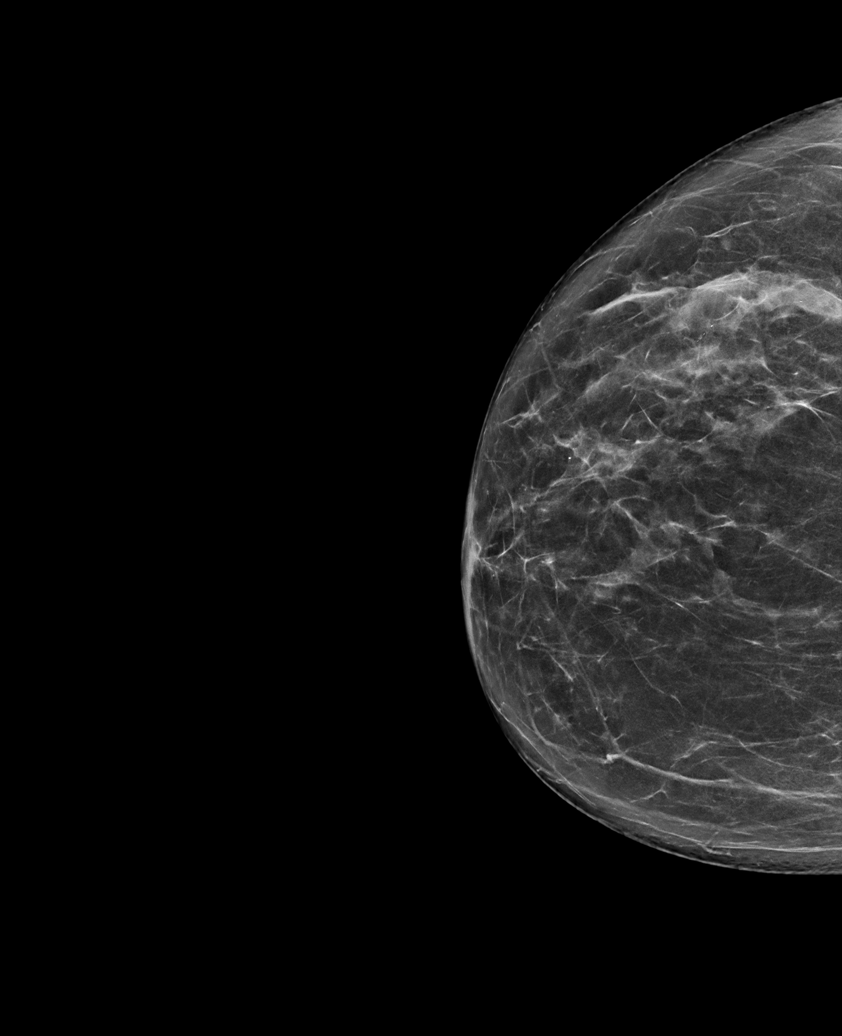

[R MLO synth-2D (1 of 2)]
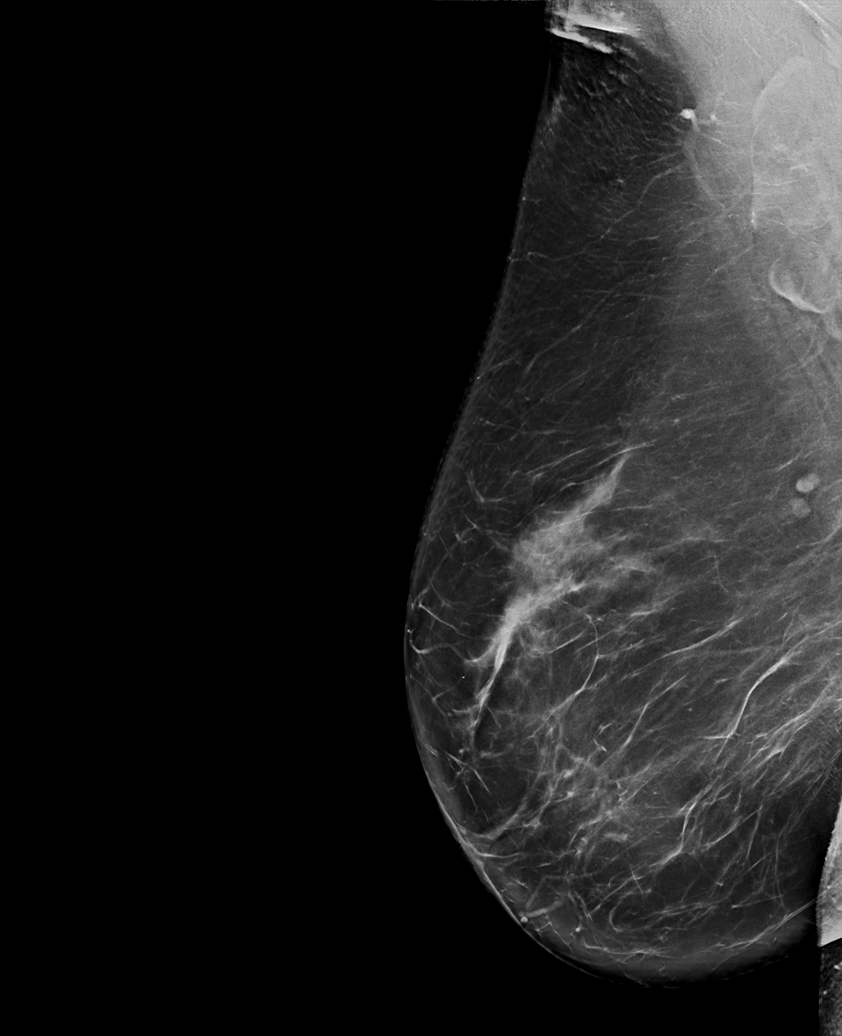

[R MLO synth-2D (2 of 2)]
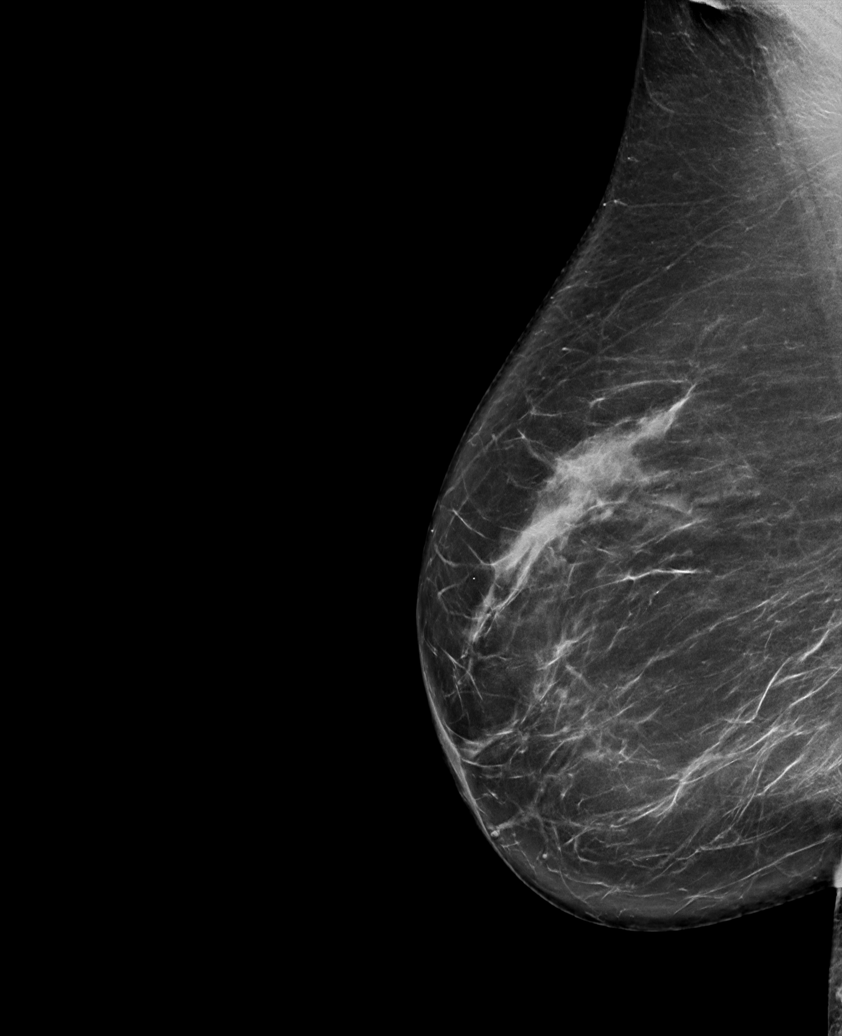

[L MLO synth-2D]
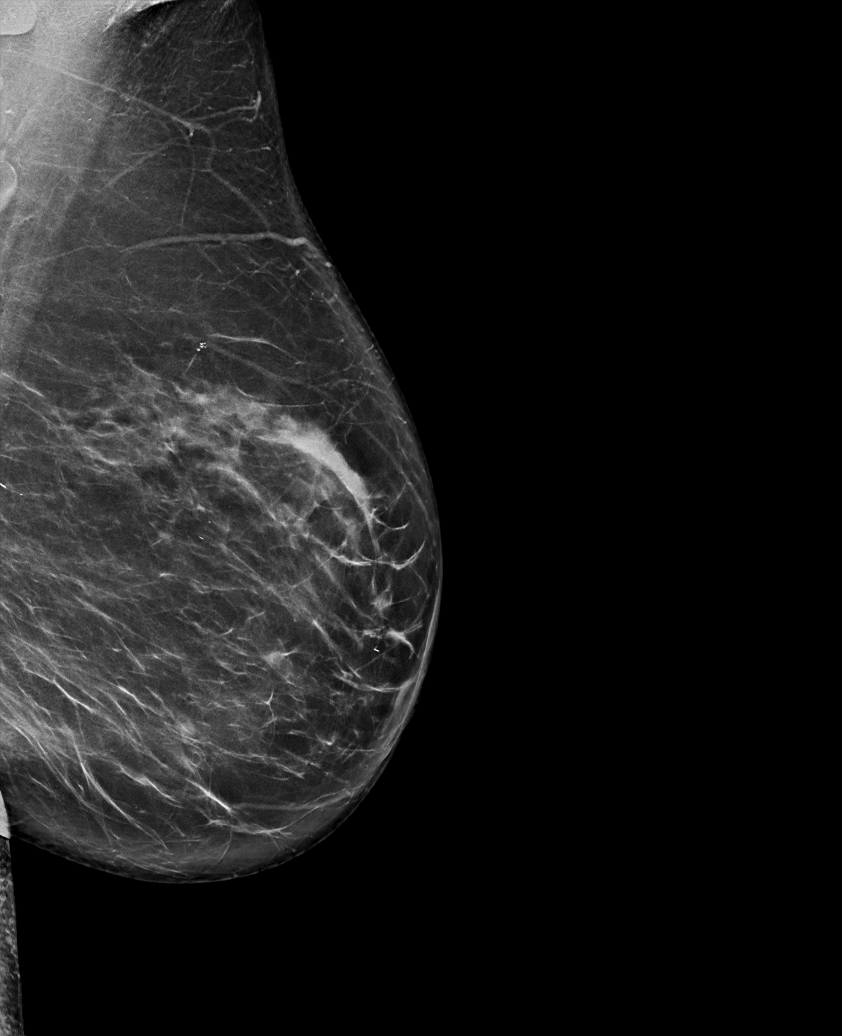

[L CC synth-2D]
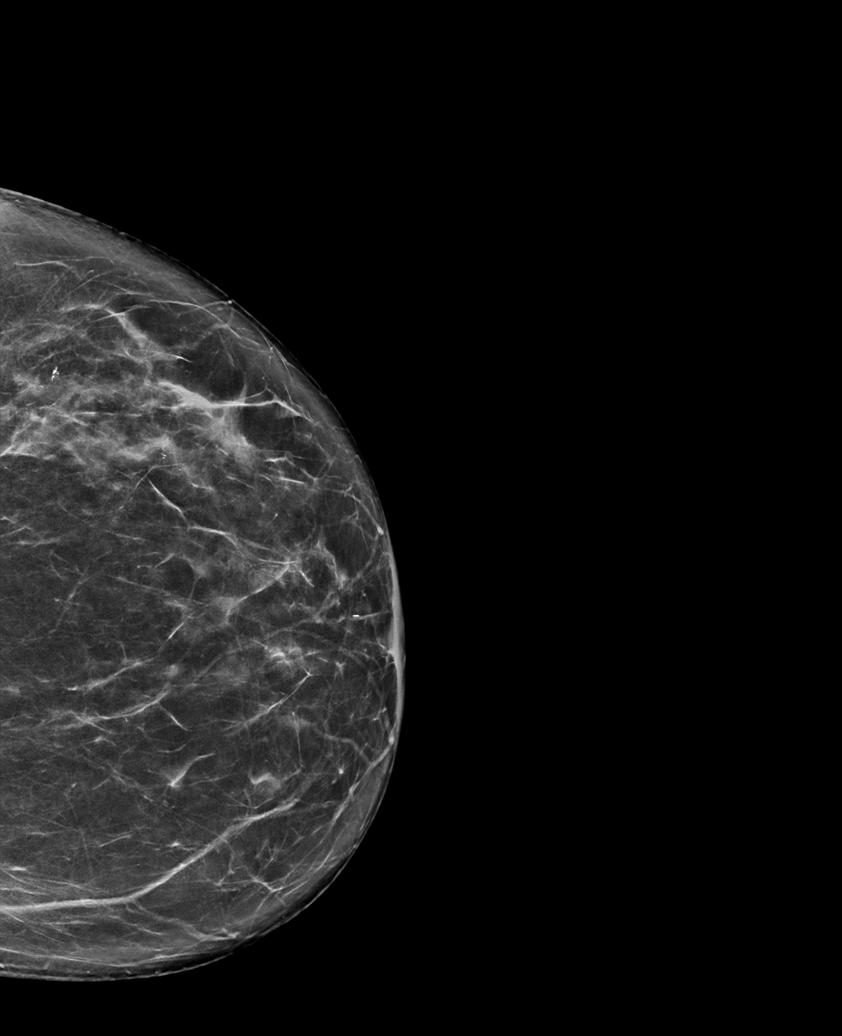

[L MLO tomo · tomo slice 45/90.0]
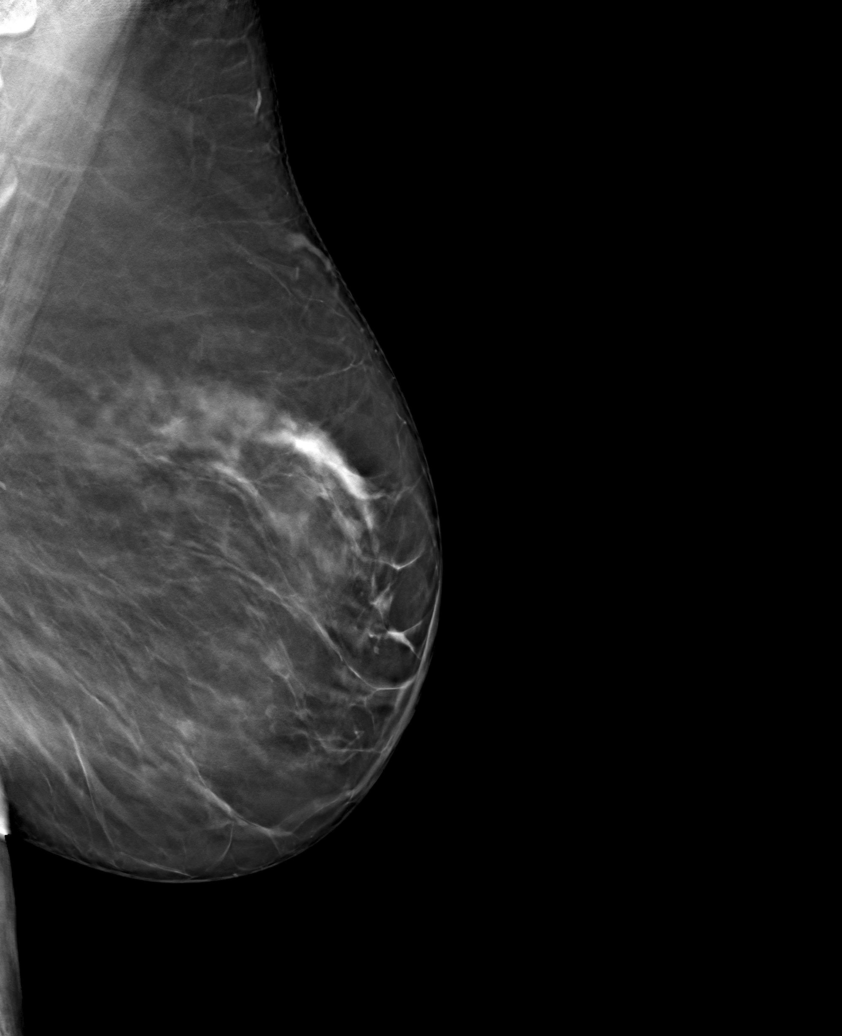

[6 of 30 positions shown; findings below may reference images not displayed]

ACR Breast Density Category c: The breast tissue is heterogeneously
dense, which may obscure small masses.
FINDINGS: There are no findings suspicious for malignancy.
IMPRESSION: No mammographic evidence of malignancy. A result letter of this
screening mammogram will be mailed directly to the patient.

RECOMMENDATION:
Screening mammogram in one year. (Code:Q3-W-BC3)

BI-RADS CATEGORY  1: Negative.

## 2023-11-08 ENCOUNTER — Ambulatory Visit (INDEPENDENT_AMBULATORY_CARE_PROVIDER_SITE_OTHER): Payer: Managed Care, Other (non HMO) | Admitting: Gastroenterology

## 2023-11-08 ENCOUNTER — Encounter (INDEPENDENT_AMBULATORY_CARE_PROVIDER_SITE_OTHER): Payer: Self-pay | Admitting: Gastroenterology

## 2023-11-08 VITALS — BP 154/70 | HR 71 | Temp 98.0°F | Ht 61.5 in | Wt 173.6 lb

## 2023-11-08 DIAGNOSIS — K760 Fatty (change of) liver, not elsewhere classified: Secondary | ICD-10-CM

## 2023-11-08 DIAGNOSIS — K7581 Nonalcoholic steatohepatitis (NASH): Secondary | ICD-10-CM

## 2023-11-08 DIAGNOSIS — R7989 Other specified abnormal findings of blood chemistry: Secondary | ICD-10-CM | POA: Diagnosis not present

## 2023-11-08 NOTE — Progress Notes (Addendum)
 Referring Provider: Theoplis Fix, MD Primary Care Physician:  Theoplis Fix, MD Primary GI Physician: Dr. Sammi Crick   Chief Complaint  Patient presents with   Follow-up    Pt arrives for follow up. Pt states no diverticulitis flares. Pt is taking ginger and tumeric in coffee. Pt lost husband in November.    HPI:   Deborah Hill is a 68 y.o. female with past medical history of DM, HTN, diverticulosis,   Patient presenting today for:  Follow up of elevated LFTs and fatty liver  Last seen June 2024, at that time no GI issues, trying to implement mediterranean diet as much as possible.  Recommended continue mediterranean diet, repeat CMP, discuss elastography at follow up  CMP in November 2024 with AST 68, ALT 81 plt count 339   Present:  States she has been mixing turmeric (just the seasoning) into her coffee each morning, has been doing ginger as well. She is trying to follow mediterranean diet as best she can, avoiding red meats. No red flag symptoms. Patient denies melena, hematochezia, nausea, vomiting, diarrhea, constipation, dysphagia, odyonophagia, early satiety or weight loss. She tries to get as much physical activity as she can, walks atleast 30 minutes a day. She is starting to do some chair exercise.   She notes that she had a fall a few weeks ago after she saw a snake and has had some Right rib pain since then, thinks she may have fractured a rib, seeing PCP regarding this.   No red flag symptoms. Patient denies melena, hematochezia, nausea, vomiting, diarrhea, constipation, dysphagia, odyonophagia, early satiety or weight loss.    Previous workup includes:  04/2022 negative AMA, IgG, A1A, ceruloplasmin, ASMA, normal ferritin  02/05/2022 showed a negative acute hepatitis panel and HIV test.   Ultrasound of the abdomen was performed on 01/29/2022 which showed hepatic steatosis and a benign liver hemangioma.  Last Colonoscopy:2019 - Dr. Louanne Roussel at [facility], diverticulosis, was told  to repeat in 10 years    Past Medical History:  Diagnosis Date   Diabetes mellitus without complication (HCC)    Hypertension     Past Surgical History:  Procedure Laterality Date   LEG SURGERY Left     Current Outpatient Medications  Medication Sig Dispense Refill   clonazePAM (KLONOPIN) 0.5 MG tablet Take 0.5 mg by mouth 2 (two) times daily as needed for anxiety.     Cyanocobalamin (B-12 PO) Take by mouth.     glipiZIDE (GLUCOTROL) 5 MG tablet Take 5 mg by mouth daily before breakfast.     lisinopril (ZESTRIL) 20 MG tablet Take 20 mg by mouth daily.     metFORMIN (GLUCOPHAGE) 500 MG tablet Take 500 mg by mouth 2 (two) times daily with a meal. 500 mg One tablet in mornings and two at bedtime.     OVER THE COUNTER MEDICATION Calcium w Magnesium and Zinc Every other day.     No current facility-administered medications for this visit.    Allergies as of 11/08/2023 - Review Complete 11/08/2023  Allergen Reaction Noted   Oxycodone-acetaminophen  12/06/2019    Social History   Socioeconomic History   Marital status: Married    Spouse name: Not on file   Number of children: Not on file   Years of education: Not on file   Highest education level: Not on file  Occupational History   Not on file  Tobacco Use   Smoking status: Former   Smokeless tobacco: Never  Vaping Use  Vaping status: Never Used  Substance and Sexual Activity   Alcohol use: Never   Drug use: Never   Sexual activity: Not on file  Other Topics Concern   Not on file  Social History Narrative   Not on file   Social Drivers of Health   Financial Resource Strain: Not on file  Food Insecurity: Not on file  Transportation Needs: Not on file  Physical Activity: Not on file  Stress: Not on file  Social Connections: Not on file    Review of systems General: negative for malaise, night sweats, fever, chills, weight loss Neck: Negative for lumps, goiter, pain and significant neck swelling Resp:  Negative for cough, wheezing, dyspnea at rest CV: Negative for chest pain, leg swelling, palpitations, orthopnea GI: denies melena, hematochezia, nausea, vomiting, diarrhea, constipation, dysphagia, odyonophagia, early satiety or unintentional weight loss.  MSK: +right rib pain  Derm: Negative for itching or rash Psych: Denies depression, anxiety, memory loss, confusion. No homicidal or suicidal ideation.  Heme: Negative for prolonged bleeding, bruising easily, and swollen nodes. Endocrine: Negative for cold or heat intolerance, polyuria, polydipsia and goiter. Neuro: negative for tremor, gait imbalance, syncope and seizures. The remainder of the review of systems is noncontributory.  Physical Exam: BP (!) 150/78   Pulse 71   Temp 98 F (36.7 C)   Ht 5' 1.5 (1.562 m)   Wt 173 lb 9.6 oz (78.7 kg)   BMI 32.27 kg/m  General:   Alert and oriented. No distress noted. Pleasant and cooperative.  Head:  Normocephalic and atraumatic. Eyes:  Conjuctiva clear without scleral icterus. Mouth:  Oral mucosa pink and moist. Good dentition. No lesions. Heart: Normal rate and rhythm, s1 and s2 heart sounds present.  Lungs: Clear lung sounds in all lobes. Respirations equal and unlabored. Abdomen:  +BS, soft, non-tender and non-distended. No rebound or guarding. No HSM or masses noted. Derm: No palmar erythema or jaundice Msk:  Symmetrical without gross deformities. Normal posture. Extremities:  Without edema. Neurologic:  Alert and  oriented x4 Psych:  Alert and cooperative. Normal mood and affect.  Invalid input(s): 6 MONTHS   ASSESSMENT: Deborah Hill is a 68 y.o. female presenting today for follow up of elevated LFTs and suspected NASH  Elevated LFTs with last dedicated liver imaging via US  in 2023 with hepatic steatosis. Workup thus far has been negative for autoimmune or infectious etiology. Ferritin WNL. Suspected secondary to NASH. She has tried Health visitor diet and  exercising more. She has actually lost some weight which I congratulated her on. She does report adding tumeric to her coffee, which I cautioned her on as there have been cases of liver injury with ongoing use of turmeric though she is not using a concentrate supplement but rather just the cooking herb itself which would be less likely to cause liver injury. Last LFTs in November were slightly elevated. FIB4: 1.45 likely excluding advanced fibrosis, at this time will repeat CMP, check fibrosure and US  liver elastography to evaluate for any presence of fibrosis. I encouraged her to continue with current physical activity as she is able and mediterranean diet.    PLAN:  -repeat CMP, Fibrosure (fasting x12 hours prior to) -continue mediterranean diet -routine exercise, 30 mins/day 4-5 days per week -overall reduction in weight of 5-7% -US  elastography (plan for this in a few weeks due to recent rib injury)   All questions were answered, patient verbalized understanding and is in agreement with plan as outlined above.  Follow Up: 6 months   Love Chowning L. Adrien Alberta, MSN, APRN, AGNP-C Adult-Gerontology Nurse Practitioner [facility]  I have reviewed the note and agree with the APP's assessment as described in this progress note  Samantha Cress, MD Gastroenterology and Hepatology [facility] Gastroenterology

## 2023-11-08 NOTE — Patient Instructions (Addendum)
-  repeat CMP, Fibrosure (fasting x12 hours prior to) -continue mediterranean diet -routine exercise, 30 mins/day 4-5 days per week -goal of overall reduction in weight by 5-7% can reduce fat build up in the liver  -US  elastography (plan for this in a few weeks due to recent rib injury)   Follow up 1 year  It was a pleasure to see you today. I want to create trusting relationships with patients and provide genuine, compassionate, and quality care. I truly value your feedback! please be on the lookout for a survey regarding your visit with me today. I appreciate your input about our visit and your time in completing this!    Tobenna Needs L. Renee Erb, MSN, APRN, AGNP-C Adult-Gerontology Nurse Practitioner [facility] Gastroenterology at [facility]

## 2023-11-09 ENCOUNTER — Telehealth (INDEPENDENT_AMBULATORY_CARE_PROVIDER_SITE_OTHER): Payer: Self-pay | Admitting: Gastroenterology

## 2023-11-09 NOTE — Telephone Encounter (Signed)
 Ultrasound scheduled for 11/29/23 at 11:30am. Pt to arrive at 11:15am to [facility]. NPO 6-8 hours prior.  Left message to return call, will also send my chart message.

## 2023-11-10 NOTE — Telephone Encounter (Signed)
 Pt returned call. Pt states she just got into her email and seen the appt for US . Pt repeated US  appt to me (11/29/23 at 11:30). Advised pt that was correct but to arrive 15 minutes early. Pt verbalized understanding

## 2023-11-11 ENCOUNTER — Telehealth (INDEPENDENT_AMBULATORY_CARE_PROVIDER_SITE_OTHER): Payer: Self-pay | Admitting: Gastroenterology

## 2023-11-11 LAB — NASH FIBROSURE(R) PLUS

## 2023-11-11 NOTE — Telephone Encounter (Signed)
 Pt left voicemail stating that she has a sonogram on 7/7 at 11:30am; pt states she had a dentist appointment at 9:30 in [place] and there is no way she will be able to make the sonogram appt. (Pt has US  11/29/23 at 11:30am).  Returned call to pt but had to leave message. Left central scheduling number on pt voicemail. Advised her to call central schedule and she could get the ultrasound rescheduled to cater to her schedule.

## 2023-11-13 LAB — NASH FIBROSURE(R) PLUS
ALPHA 2-MACROGLOBULINS, QN: 228 mg/dL (ref 110–276)
ALT (SGPT) P5P: 30 IU/L (ref 0–40)
AST (SGOT) P5P: 37 IU/L (ref 0–40)
Apolipoprotein A-1: 171 mg/dL (ref 116–209)
Bilirubin, Total: 0.2 mg/dL (ref 0.0–1.2)
Cholesterol, Total: 228 mg/dL — ABNORMAL HIGH (ref 100–199)
Fibrosis Score: 0.07 (ref 0.00–0.21)
GGT: 8 IU/L (ref 0–60)
Glucose: 117 mg/dL — ABNORMAL HIGH (ref 70–99)
Haptoglobin: 177 mg/dL (ref 37–355)
NASH Score: 0.52 — ABNORMAL HIGH (ref 0.00–0.25)
Steatosis Score: 0.42 — ABNORMAL HIGH (ref 0.00–0.40)
Triglycerides: 163 mg/dL — ABNORMAL HIGH (ref 0–149)

## 2023-11-13 LAB — COMPREHENSIVE METABOLIC PANEL WITH GFR
ALT: 25 IU/L (ref 0–32)
AST: 32 IU/L (ref 0–40)
Albumin: 4.5 g/dL (ref 3.9–4.9)
Alkaline Phosphatase: 118 IU/L (ref 44–121)
BUN/Creatinine Ratio: 14 (ref 12–28)
BUN: 13 mg/dL (ref 8–27)
Bilirubin Total: 0.3 mg/dL (ref 0.0–1.2)
CO2: 18 mmol/L — ABNORMAL LOW (ref 20–29)
Calcium: 9.8 mg/dL (ref 8.7–10.3)
Chloride: 105 mmol/L (ref 96–106)
Creatinine, Ser: 0.96 mg/dL (ref 0.57–1.00)
Globulin, Total: 2.4 g/dL (ref 1.5–4.5)
Glucose: 108 mg/dL — ABNORMAL HIGH (ref 70–99)
Potassium: 4.9 mmol/L (ref 3.5–5.2)
Sodium: 142 mmol/L (ref 134–144)
Total Protein: 6.9 g/dL (ref 6.0–8.5)
eGFR: 66 mL/min/{1.73_m2} (ref >59)

## 2023-11-15 ENCOUNTER — Ambulatory Visit (INDEPENDENT_AMBULATORY_CARE_PROVIDER_SITE_OTHER): Payer: Self-pay | Admitting: Gastroenterology

## 2023-11-29 ENCOUNTER — Ambulatory Visit (HOSPITAL_COMMUNITY)

## 2023-11-30 ENCOUNTER — Ambulatory Visit (HOSPITAL_COMMUNITY)
Admission: RE | Admit: 2023-11-30 | Discharge: 2023-11-30 | Disposition: A | Discharge status: Home / Self Care | Source: Ambulatory Visit | Attending: Gastroenterology | Admitting: Gastroenterology

## 2023-11-30 DIAGNOSIS — R7989 Other specified abnormal findings of blood chemistry: Secondary | ICD-10-CM | POA: Diagnosis present

## 2023-11-30 DIAGNOSIS — K7581 Nonalcoholic steatohepatitis (NASH): Secondary | ICD-10-CM | POA: Insufficient documentation

## 2024-03-07 ENCOUNTER — Other Ambulatory Visit: Payer: Self-pay | Admitting: Nurse Practitioner

## 2024-03-07 DIAGNOSIS — Z1231 Encounter for screening mammogram for malignant neoplasm of breast: Secondary | ICD-10-CM

## 2024-03-08 ENCOUNTER — Encounter (INDEPENDENT_AMBULATORY_CARE_PROVIDER_SITE_OTHER): Payer: Self-pay | Admitting: Gastroenterology

## 2024-03-08 ENCOUNTER — Inpatient Hospital Stay: Admission: RE | Admit: 2024-03-08 | Source: Ambulatory Visit

## 2024-03-28 ENCOUNTER — Ambulatory Visit
Admission: RE | Admit: 2024-03-28 | Discharge: 2024-03-28 | Disposition: A | Discharge status: Home / Self Care | Source: Ambulatory Visit

## 2024-03-28 DIAGNOSIS — Z1231 Encounter for screening mammogram for malignant neoplasm of breast: Secondary | ICD-10-CM
